# Patient Record
Sex: Female | Born: 1970 | Race: White | Hispanic: No | Marital: Married | Smoking: Current every day smoker
Health system: Southern US, Community
[De-identification: ages and names within clinical notes are randomized; demographics above are authoritative.]

## PROBLEM LIST (undated history)

## (undated) HISTORY — PX: BREAST BIOPSY: SHX20

---

## 2019-07-27 ENCOUNTER — Encounter: Payer: Self-pay | Admitting: Family Medicine

## 2019-07-27 ENCOUNTER — Other Ambulatory Visit: Payer: Self-pay

## 2019-07-27 ENCOUNTER — Ambulatory Visit (INDEPENDENT_AMBULATORY_CARE_PROVIDER_SITE_OTHER): Payer: BC Managed Care – PPO | Admitting: Family Medicine

## 2019-07-27 VITALS — BP 152/88 | HR 72 | Wt 145.2 lb

## 2019-07-27 DIAGNOSIS — Z124 Encounter for screening for malignant neoplasm of cervix: Secondary | ICD-10-CM

## 2019-07-27 DIAGNOSIS — Z01419 Encounter for gynecological examination (general) (routine) without abnormal findings: Secondary | ICD-10-CM

## 2019-07-27 DIAGNOSIS — Z1151 Encounter for screening for human papillomavirus (HPV): Secondary | ICD-10-CM

## 2019-07-27 NOTE — Progress Notes (Signed)
Order place Mammogram  Order Colon screening  Decline flu No sti today Smokes cig Nexplanon replace in 2021

## 2019-07-27 NOTE — Progress Notes (Signed)
Subjective:     Caitlin Melton is a 48 y.o. female who presents for annual visit. She states that she is feeling well and has no specific concerns. Denies chest pain, dyspnea, abdominal pain, n/v, fatigue,   Medical History: - Anxiety - Hypertriglyceridemia - Gestational Diabetes Mellitus  Medications:  - Alprazolam 0.5mg  tablet, bedtime PRN - Fluoxetine 20mg /87mL solution, daily - Gemfibrozil 600mg  tablet, 2 times daily before meals - Pantoprazole 20mg  tablet, daily  Allergies: N/A. NKDA  Surgical History: N/A  Social History: Pt states that she works at [employer]  ([place]). She lives with her husband. She is not currently sexually active. States that she has had 1 lifetime sexual partner in a monogamous relationship. Pt states that she gets exercise through being active at her work. She reports that she had lost weight intentionally through exercise and healthy eating, and feels that she has regained weight due to COVID. Patient describes her diet as consisting of burgers, hot dogs, bread. States that she was previously eating healthy and found it difficult to stay on track. Says that her hypertriglyceridemia had been a driver for healthy habits and that she is motivated by how she felt better overall when exercising and making healthy food choices.  Do you have a primary care provider? Yes - Alvester Chou, NP at [facility] How many times per week do you exercise? Active through work  Gynecologic History No LMP recorded (lmp unknown). Pt endorses early menopause (late 30s/early 84s). Contraception: none. Not interested in contraception Last Pap: 5 years ago. Results were: normal Last mammogram: 2 years ago. Results were: normal No prior colonoscopy.  Obstetric History OB History  Gravida Para Term Preterm AB Living  1 1 1         SAB TAB Ectopic Multiple Live Births               # Outcome Date GA Lbr Len/2nd Weight Sex Delivery Anes PTL Lv  1 Term 06/29/96            Review of Systems Pertinent items are noted in HPI.    Objective:  General: Alert, well-appearing, talkative, no acute distress, sitting up on exam table CV: Normal S1, S2. RRR. 2+ radial and dorsalis pedis pulses Pulmonary: CTAB. No wheezes. Normal respiratory effort Abdominal: Non-distended, non-tender. Active bowel sounds Breast exam: No abnormal masses, tenderness, nipple discharge. No axillary or clavicular lymphadenopathy Pelvic exam: No cervical motion tenderness, adnexal tenderness, adnexal masses.  Vaginal exam: No vaginal erythema, discharge.     Assessment/Plan:  1. Well woman exam with routine gynecological exam Ms. Nevel is a 48yo G1p1 with a PMHx of hypertriglyceridemia and anxiety who presents for annual exam. She is doing well. Declined STI screening today.  - Provided anticipatory guidance and used motivational interviewing techniques for healthy diet and exercise. - Performed breast exam - Mammogram Screening Bilateral Tomo; Future - DG Bone Density; Future. Discussed the benefits of DEXA scan given early age of menopause and risk of bone loss.  - Cytology - PAP( [place]) - Encouraged colonoscopy and discussion with PCP. Reviewed possibility of yearly FOBT as another modality to screen for colon cancer.  - Advised pt to continue monitoring with PCP, and to follow-up with PCP to ensure regular HbA1c screens given history of GDM.  Follow up in: 1 year or as needed.   Cristela Felt, Medical Student 3:39 PM

## 2019-07-29 LAB — CYTOLOGY - PAP
Comment: NEGATIVE
Diagnosis: NEGATIVE
High risk HPV: NEGATIVE

## 2019-07-30 NOTE — Patient Instructions (Signed)

## 2019-08-02 ENCOUNTER — Ambulatory Visit: Payer: BC Managed Care – PPO | Attending: Internal Medicine

## 2019-08-02 DIAGNOSIS — Z20822 Contact with and (suspected) exposure to covid-19: Secondary | ICD-10-CM

## 2019-08-03 ENCOUNTER — Other Ambulatory Visit: Payer: BC Managed Care – PPO

## 2019-08-04 LAB — NOVEL CORONAVIRUS, NAA: SARS-CoV-2, NAA: NOT DETECTED

## 2019-08-22 DIAGNOSIS — I10 Essential (primary) hypertension: Secondary | ICD-10-CM | POA: Diagnosis not present

## 2019-08-26 ENCOUNTER — Ambulatory Visit: Payer: BC Managed Care – PPO | Attending: Internal Medicine

## 2019-08-26 DIAGNOSIS — Z20822 Contact with and (suspected) exposure to covid-19: Secondary | ICD-10-CM

## 2019-08-27 LAB — NOVEL CORONAVIRUS, NAA: SARS-CoV-2, NAA: NOT DETECTED

## 2019-09-12 DIAGNOSIS — H00015 Hordeolum externum left lower eyelid: Secondary | ICD-10-CM | POA: Diagnosis not present

## 2019-09-15 ENCOUNTER — Ambulatory Visit
Admission: RE | Admit: 2019-09-15 | Discharge: 2019-09-15 | Disposition: A | Discharge status: Home / Self Care | Payer: BC Managed Care – PPO | Source: Ambulatory Visit | Attending: Family Medicine | Admitting: Family Medicine

## 2019-09-15 ENCOUNTER — Encounter: Payer: Self-pay | Admitting: Family Medicine

## 2019-09-15 DIAGNOSIS — E28319 Asymptomatic premature menopause: Secondary | ICD-10-CM | POA: Insufficient documentation

## 2019-09-15 DIAGNOSIS — Z1231 Encounter for screening mammogram for malignant neoplasm of breast: Secondary | ICD-10-CM | POA: Diagnosis not present

## 2019-09-15 DIAGNOSIS — M8589 Other specified disorders of bone density and structure, multiple sites: Secondary | ICD-10-CM | POA: Diagnosis not present

## 2019-09-15 DIAGNOSIS — Z01419 Encounter for gynecological examination (general) (routine) without abnormal findings: Secondary | ICD-10-CM | POA: Insufficient documentation

## 2019-09-15 DIAGNOSIS — Z780 Asymptomatic menopausal state: Secondary | ICD-10-CM | POA: Diagnosis not present

## 2019-09-15 IMAGING — MG DIGITAL SCREENING BILAT W/ TOMO W/ CAD
6 of 10 series · 6 of 30 positions shown · non-contrast
Comparison: Previous exam(s).

CLINICAL DATA: Screening.

EXAM:
DIGITAL SCREENING BILATERAL MAMMOGRAM WITH TOMO AND CAD

[R MLO synth-2D (1 of 2)]
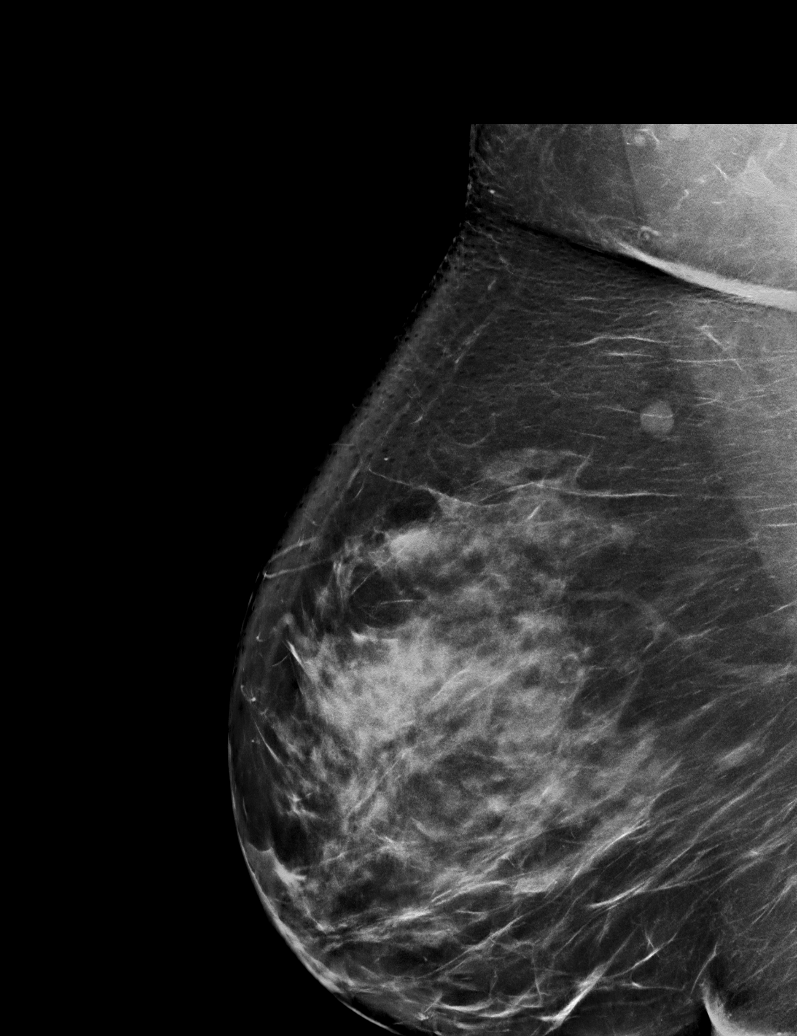

[L CC synth-2D]
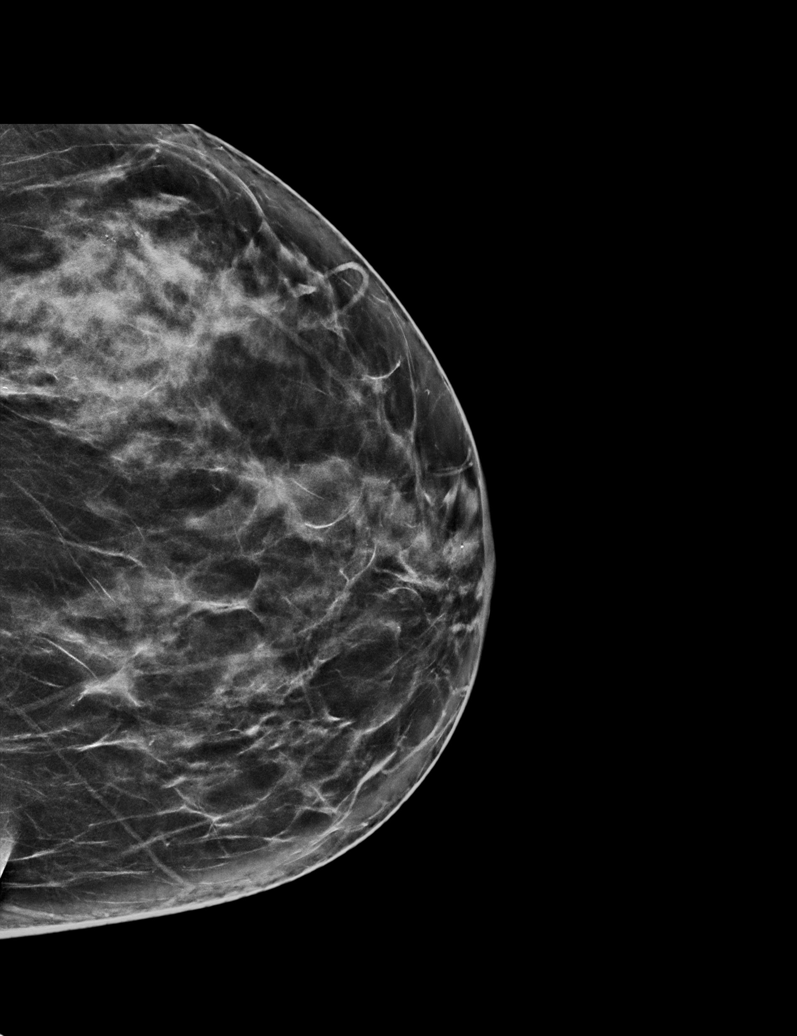

[L MLO synth-2D]
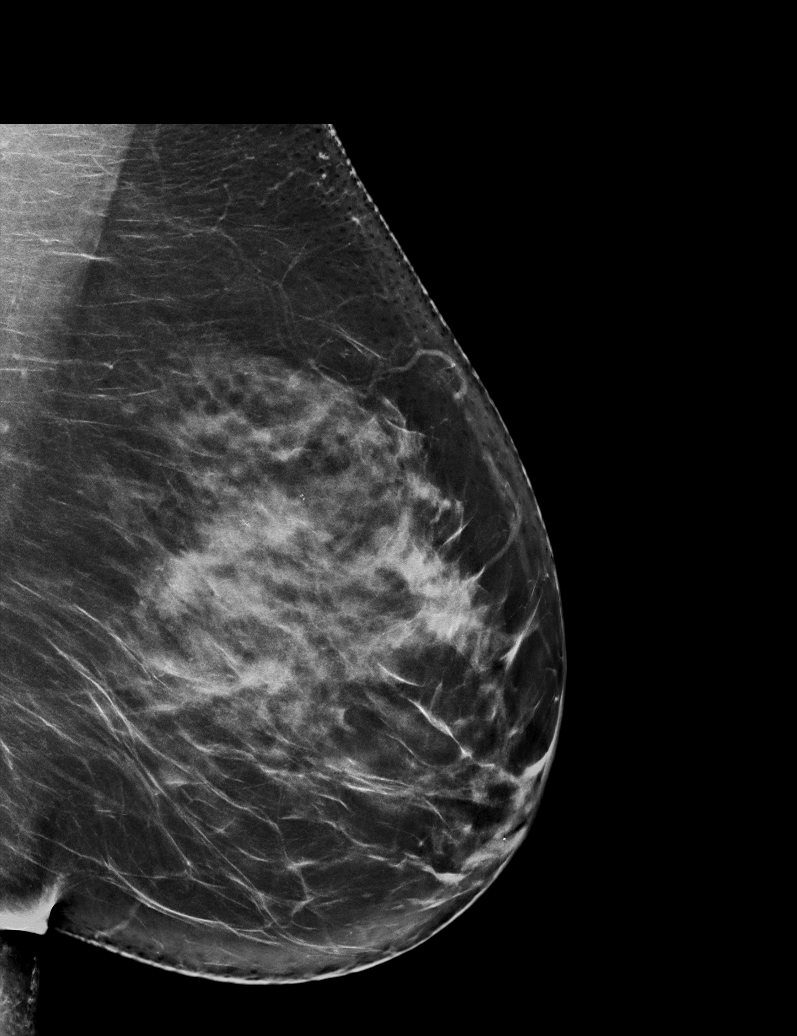

[R MLO synth-2D (2 of 2)]
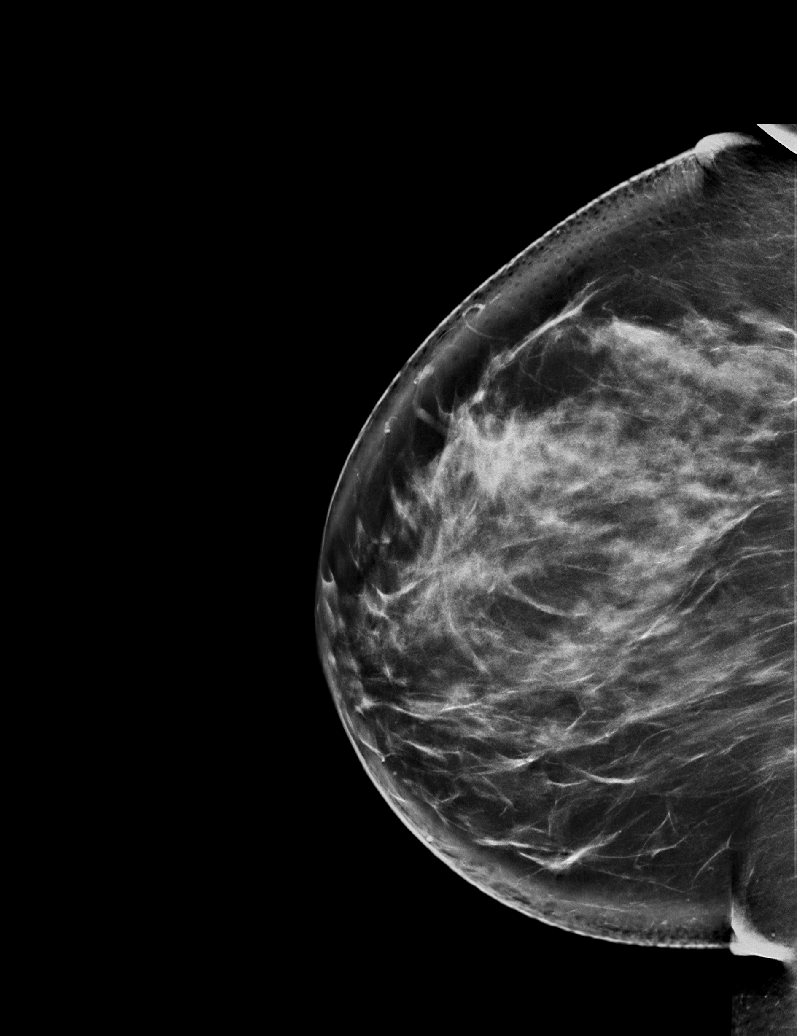

[R CC synth-2D]
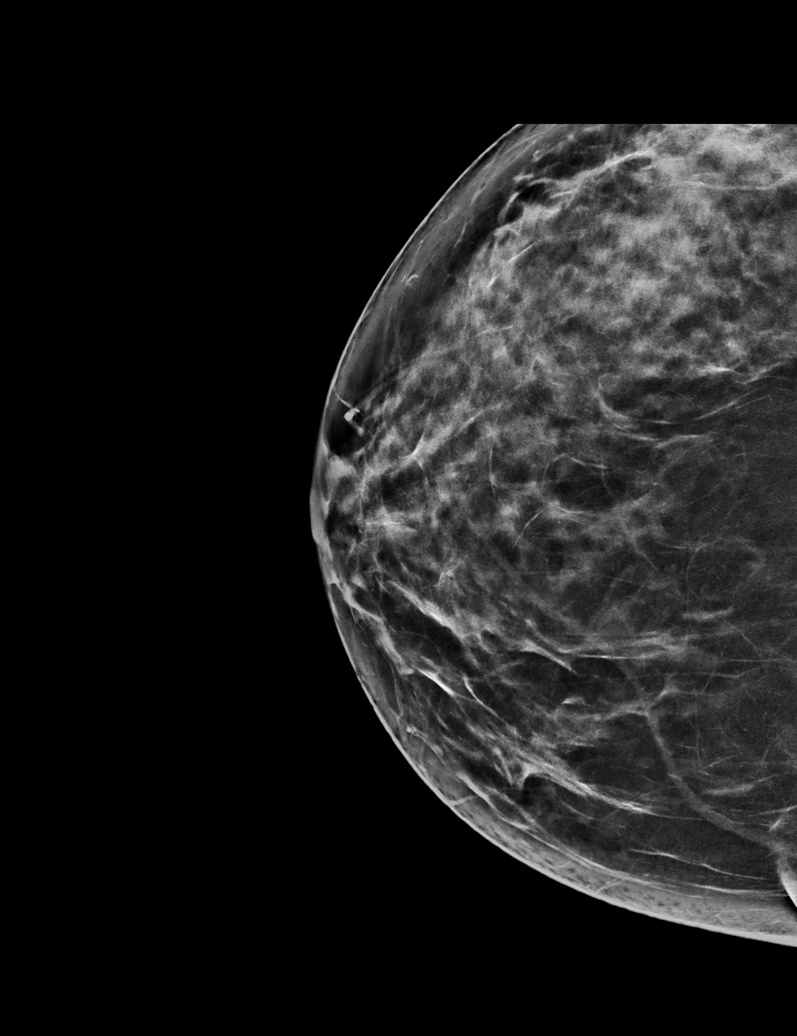

[R MLO tomo · tomo slice 47/92.0]
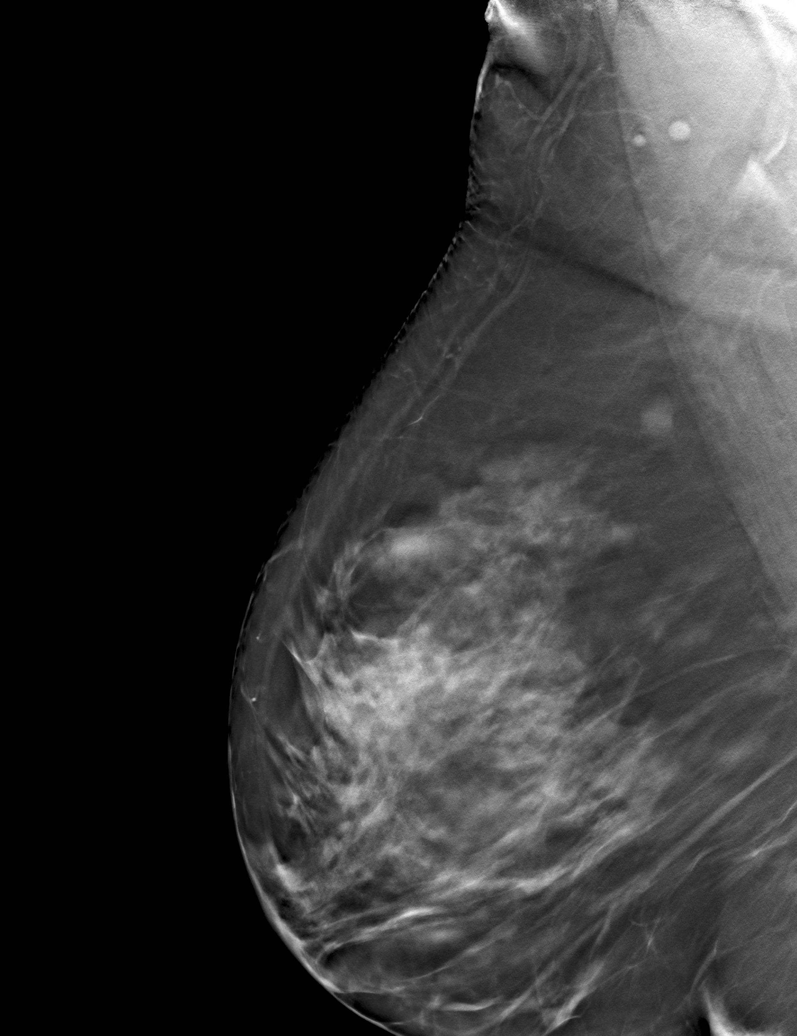

[6 of 30 positions shown; findings below may reference images not displayed]

ACR Breast Density Category c: The breast tissue is heterogeneously
dense, which may obscure small masses.
FINDINGS: There are no findings suspicious for malignancy. Images were
processed with CAD.
IMPRESSION: No mammographic evidence of malignancy. A result letter of this
screening mammogram will be mailed directly to the patient.

RECOMMENDATION:
Screening mammogram in one year. (Code:FT-U-LHB)

BI-RADS CATEGORY  1: Negative.

## 2021-07-25 DIAGNOSIS — H524 Presbyopia: Secondary | ICD-10-CM | POA: Diagnosis not present

## 2021-07-25 DIAGNOSIS — H52222 Regular astigmatism, left eye: Secondary | ICD-10-CM | POA: Diagnosis not present

## 2021-07-25 DIAGNOSIS — H5213 Myopia, bilateral: Secondary | ICD-10-CM | POA: Diagnosis not present

## 2021-12-20 DIAGNOSIS — I10 Essential (primary) hypertension: Secondary | ICD-10-CM | POA: Diagnosis not present

## 2021-12-20 DIAGNOSIS — E785 Hyperlipidemia, unspecified: Secondary | ICD-10-CM | POA: Diagnosis not present

## 2022-02-04 ENCOUNTER — Other Ambulatory Visit: Payer: Self-pay | Admitting: Adult Health

## 2022-02-04 ENCOUNTER — Other Ambulatory Visit: Payer: Self-pay | Admitting: Family Medicine

## 2022-02-04 DIAGNOSIS — Z1231 Encounter for screening mammogram for malignant neoplasm of breast: Secondary | ICD-10-CM

## 2022-02-28 ENCOUNTER — Ambulatory Visit
Admission: RE | Admit: 2022-02-28 | Discharge: 2022-02-28 | Disposition: A | Discharge status: Home / Self Care | Payer: BC Managed Care – PPO | Source: Ambulatory Visit | Attending: Adult Health | Admitting: Adult Health

## 2022-02-28 DIAGNOSIS — Z1231 Encounter for screening mammogram for malignant neoplasm of breast: Secondary | ICD-10-CM | POA: Diagnosis not present

## 2022-06-12 DIAGNOSIS — E785 Hyperlipidemia, unspecified: Secondary | ICD-10-CM | POA: Diagnosis not present

## 2022-06-26 DIAGNOSIS — Z13220 Encounter for screening for lipoid disorders: Secondary | ICD-10-CM | POA: Diagnosis not present

## 2022-06-26 DIAGNOSIS — K219 Gastro-esophageal reflux disease without esophagitis: Secondary | ICD-10-CM | POA: Diagnosis not present

## 2022-07-21 ENCOUNTER — Other Ambulatory Visit (HOSPITAL_COMMUNITY)
Admission: RE | Admit: 2022-07-21 | Discharge: 2022-07-21 | Disposition: A | Discharge status: Home / Self Care | Payer: BC Managed Care – PPO | Source: Ambulatory Visit | Attending: Family Medicine | Admitting: Family Medicine

## 2022-07-21 ENCOUNTER — Encounter: Payer: Self-pay | Admitting: Family Medicine

## 2022-07-21 ENCOUNTER — Ambulatory Visit (INDEPENDENT_AMBULATORY_CARE_PROVIDER_SITE_OTHER): Payer: BC Managed Care – PPO | Admitting: Family Medicine

## 2022-07-21 VITALS — BP 171/82 | HR 109 | Ht 59.0 in | Wt 138.0 lb

## 2022-07-21 DIAGNOSIS — Z01419 Encounter for gynecological examination (general) (routine) without abnormal findings: Secondary | ICD-10-CM | POA: Diagnosis not present

## 2022-07-21 DIAGNOSIS — N912 Amenorrhea, unspecified: Secondary | ICD-10-CM

## 2022-07-21 DIAGNOSIS — Z3046 Encounter for surveillance of implantable subdermal contraceptive: Secondary | ICD-10-CM

## 2022-07-21 NOTE — Progress Notes (Signed)
Patient presents for Annual.   Last pap:12/162020  Contraception:Nexplanon STD Screening: Declined  Mammogram:02/28/22 Family Hx Of Breast Cancer: Yes. Mother recently had Mastectomy   CC: Pt states she wants Nexplanon replaced helps with hot flashes and does not want to be pregnant.   Fun Fact: Patient Loves to laugh.

## 2022-07-21 NOTE — Progress Notes (Signed)
GYNECOLOGY ANNUAL PREVENTATIVE CARE ENCOUNTER NOTE  Subjective:   Caitlin Melton is a 51 y.o. G46P1000 female here for a routine annual gynecologic exam.  Current complaints: none, wants nexplanon removed. Worried about pregnancy..     Denies abnormal vaginal bleeding, discharge, pelvic pain, problems with intercourse or other gynecologic concerns.    Gynecologic History No LMP recorded (lmp unknown). Patient is postmenopausal. Contraception: Nexplanon Last Pap: 2020. Results were: normal Last mammogram: 2023. Results were: normal  Health Maintenance Due  Topic Date Due   COVID-19 Vaccine (1) Never done   HIV Screening  Never done   Hepatitis C Screening  Never done   DTaP/Tdap/Td (1 - Tdap) Never done   COLONOSCOPY (Pts 45-15yrs Insurance coverage will need to be confirmed)  Never done   Zoster Vaccines- Shingrix (1 of 2) Never done   INFLUENZA VACCINE  Never done    The following portions of the patient's history were reviewed and updated as appropriate: allergies, current medications, past family history, past medical history, past social history, past surgical history and problem list.  Review of Systems Pertinent items are noted in HPI.   Objective:  BP (!) 171/82   Pulse (!) 109   Ht 4\' 11"  (1.499 m)   Wt 138 lb (62.6 kg)   LMP  (LMP Unknown)   BMI 27.87 kg/m  CONSTITUTIONAL: Well-developed, well-nourished female in no acute distress.  HENT:  Normocephalic, atraumatic, External right and left ear normal. Oropharynx is clear and moist EYES:  No scleral icterus.  NECK: Normal range of motion, supple, no masses.  Normal thyroid.  SKIN: Skin is warm and dry. No rash noted. Not diaphoretic. No erythema. No pallor. NEUROLOGIC: Alert and oriented to person, place, and time. Normal reflexes, muscle tone coordination. No cranial nerve deficit noted. PSYCHIATRIC: Normal mood and affect. Normal behavior. Normal judgment and thought content. CARDIOVASCULAR: Normal heart rate  noted, regular rhythm. 2+ distal pulses. RESPIRATORY: Effort and breath sounds normal, no problems with respiration noted. BREASTS: Symmetric in size. No masses, skin changes, nipple drainage, or lymphadenopathy. ABDOMEN: Soft,  no distention noted.  No tenderness, rebound or guarding.  PELVIC: Normal appearing external genitalia; Mildly atrophic vaginal mucosa and cervix. No abnormal discharge noted.  Pap smear obtained.  Normal uterine size, no other palpable masses, no uterine or adnexal tenderness. MUSCULOSKELETAL: Normal range of motion.   PROCEDURE NOTE: Nexplanon Removal Patient identified, informed consent performed, consent signed.   Appropriate time out taken. Nexplanon site identified.  Area prepped in usual sterile fashon. 3 ml of 1% lidocaine with Epinephrine was used to anesthetize the area at the distal end of the implant and along implant site. A small stab incision was made right beside the implant on the distal portion.  The Nexplanon rod was grasped using hemostats and removed without difficulty.  There was minimal blood loss. There were no complications.  Steri-strips were applied over the small incision.  A pressure bandage was applied to reduce any bruising.  The patient tolerated the procedure well and was given post procedure instructions.       Assessment and Plan:  1) Annual gynecologic examination with pap smear:  Will follow up results of pap smear and manage accordingly. STI screening desired No.  Routine preventative health maintenance measures emphasized. Reviewed perimenopausal symptoms and management.    1. Amenorrhea - Follicle stimulating hormone - Counseled that she has gone through menopause and cannot get pregnant, will get [facility] to confirm.  - Reviewed that  if HRT is needed we would use other methods (not nexplanon)  2. Well woman exam with routine gynecological exam - Cytology - PAP  3. Nexplanon removal Removed easily!   Please refer to After Visit  Summary for other counseling recommendations.   No follow-ups on file.  Federico Flake, MD, MPH, ABFM Attending Physician Center for [facility]

## 2022-07-22 LAB — FOLLICLE STIMULATING HORMONE: FSH: 78.3 m[IU]/mL

## 2022-07-25 LAB — CYTOLOGY - PAP
Comment: NEGATIVE
Diagnosis: NEGATIVE
High risk HPV: NEGATIVE
